# Patient Record
Sex: Female | Born: 1975 | Race: White | Hispanic: No | Marital: Single | Smoking: Current every day smoker
Health system: Southern US, Community
[De-identification: ages and names within clinical notes are randomized; demographics above are authoritative.]

## PROBLEM LIST (undated history)

## (undated) DIAGNOSIS — J40 Bronchitis, not specified as acute or chronic: Secondary | ICD-10-CM

## (undated) DIAGNOSIS — J189 Pneumonia, unspecified organism: Secondary | ICD-10-CM

---

## 2015-12-28 ENCOUNTER — Emergency Department (INDEPENDENT_AMBULATORY_CARE_PROVIDER_SITE_OTHER)
Admission: EM | Admit: 2015-12-28 | Discharge: 2015-12-28 | Disposition: A | Discharge status: Home / Self Care | Payer: Medicaid Other | Source: Home / Self Care | Attending: Family Medicine | Admitting: Family Medicine

## 2015-12-28 ENCOUNTER — Emergency Department (INDEPENDENT_AMBULATORY_CARE_PROVIDER_SITE_OTHER): Payer: Medicaid Other

## 2015-12-28 ENCOUNTER — Encounter: Payer: Self-pay | Admitting: Emergency Medicine

## 2015-12-28 DIAGNOSIS — J069 Acute upper respiratory infection, unspecified: Secondary | ICD-10-CM

## 2015-12-28 DIAGNOSIS — R509 Fever, unspecified: Secondary | ICD-10-CM

## 2015-12-28 DIAGNOSIS — R05 Cough: Secondary | ICD-10-CM

## 2015-12-28 DIAGNOSIS — B9789 Other viral agents as the cause of diseases classified elsewhere: Principal | ICD-10-CM

## 2015-12-28 HISTORY — DX: Bronchitis, not specified as acute or chronic: J40

## 2015-12-28 HISTORY — DX: Pneumonia, unspecified organism: J18.9

## 2015-12-28 MED ORDER — BENZONATATE 200 MG PO CAPS: 200.0000 mg | ORAL_CAPSULE | Freq: Every day | ORAL | Status: AC

## 2015-12-28 MED ORDER — AZITHROMYCIN 250 MG PO TABS: ORAL_TABLET | ORAL | Status: AC

## 2015-12-28 NOTE — ED Notes (Signed)
Reports onset of cough with fever greater than 100 two days ago; now has laryngitis and pain in upper chest when she coughs. Has had both bronchitis and pneumonia in past.

## 2015-12-28 NOTE — Discharge Instructions (Signed)
Take plain guaifenesin (1200mg  extended release tabs such as Mucinex) twice daily, with plenty of water, for cough and congestion.  May add Pseudoephedrine (30mg , one or two every 4 to 6 hours) for sinus congestion.  Get adequate rest.   May use Afrin nasal spray (or generic oxymetazoline) twice daily for about 5 days and then discontinue.  Also recommend using saline nasal spray several times daily and saline nasal irrigation (AYR is a common brand).  Use Flonase nasal spray each morning after using Afrin nasal spray and saline nasal irrigation. Try warm salt water gargles for sore throat.  Stop all antihistamines for now, and other non-prescription cough/cold preparations. May take Ibuprofen 200mg , 4 tabs every 8 hours with food for headache, fever, sore throat, etc.   Follow-up with family doctor if not improving about10 days.

## 2015-12-28 NOTE — ED Provider Notes (Signed)
CSN: [id number]     Arrival date & time 12/28/15  1710 History   None    Chief Complaint  Patient presents with  . Cough  . Fever      HPI Comments: Patient complains of two day history of typical cold-like symptoms including mild sore throat, sinus congestion, headache, fatigue, and cough.  She had fever initially to 102. She has a history of bronchitis, and pneumonia two years ago.  The history is provided by the patient.    Past Medical History  Diagnosis Date  . Bronchitis   . Pneumonia    History reviewed. No pertinent past surgical history. Family History  Problem Relation Age of Onset  . Diabetes Sister    Social History  Substance Use Topics  . Smoking status: Current Every Day Smoker  . Smokeless tobacco: None  . Alcohol Use: Yes   OB History    No data available     Review of Systems + sore throat + hoarse + cough No pleuritic pain No wheezing + nasal congestion + post-nasal drainage No sinus pain/pressure No itchy/red eyes No earache No hemoptysis No SOB + fever, + chills No nausea No vomiting No abdominal pain No diarrhea No urinary symptoms No skin rash + fatigue No myalgias + headache Used OTC meds without relief  Allergies  Review of patient's allergies indicates no known allergies.  Home Medications   Prior to Admission medications   Medication Sig Start Date End Date Taking? Authorizing Provider  azithromycin (ZITHROMAX Z-PAK) 250 MG tablet Take 2 tabs today; then begin one tab once daily for 4 more days. 12/28/15   Lattie HawStephen A Beese, MD  benzonatate (TESSALON) 200 MG capsule Take 1 capsule (200 mg total) by mouth at bedtime. Take as needed for cough 12/28/15   Lattie HawStephen A Beese, MD   Meds Ordered and Administered this Visit  Medications - No data to display  BP 111/72 mmHg  Pulse 95  Temp(Src) 98.6 F (37 C) (Oral)  Resp 16  Ht 5\' 6"  (1.676 m)  Wt 145 lb (65.772 kg)  BMI 23.41 kg/m2  SpO2 99%  LMP 12/10/2015 No data  found.   Physical Exam Nursing notes and Vital Signs reviewed. Appearance:  Patient appears stated age, and in no acute distress Eyes:  Pupils are equal, round, and reactive to light and accomodation.  Extraocular movement is intact.  Conjunctivae are not inflamed  Ears:  Canals normal.  Tympanic membranes normal.  Nose:  Mildly congested turbinates.  No sinus tenderness.   Pharynx:  Normal Neck:  Supple.  Tender enlarged posterior/lateral nodes are palpated bilaterally  Lungs:  Clear to auscultation.  Breath sounds are equal.  Moving air well. Heart:  Regular rate and rhythm without murmurs, rubs, or gallops.  Abdomen:  Nontender without masses or hepatosplenomegaly.  Bowel sounds are present.  No CVA or flank tenderness.  Extremities:  No edema.  Skin:  No rash present.   ED Course  Procedures none   Imaging Review Dg Chest 2 View  12/28/2015  CLINICAL DATA:  Cough, fever. EXAM: CHEST  2 VIEW COMPARISON:  None. FINDINGS: The heart size and mediastinal contours are within normal limits. Both lungs are clear. No pneumothorax or pleural effusion is noted. The visualized skeletal structures are unremarkable. IMPRESSION: No active cardiopulmonary disease. Electronically Signed   By: Lupita RaiderJames  Green Jr, M.D.   On: 12/28/2015 17:59      MDM   1. Viral URI with cough  With her past history of pneumonia, will begin empiric Z-pack. Prescription written for Benzonatate [facility]) to take at bedtime for night-time cough.  Take plain guaifenesin (  extended release tabs such as Mucinex) twice daily, with plenty of water, for cough and congestion.  May add Pseudoephedrine ( , one or two every 4 to 6 hours) for sinus congestion.  Get adequate rest.   May use Afrin nasal spray (or generic oxymetazoline) twice daily for about 5 days and then discontinue.  Also recommend using saline nasal spray several times daily and saline nasal irrigation (AYR is a common brand).  Use Flonase nasal spray  each morning after using Afrin nasal spray and saline nasal irrigation. Try warm salt water gargles for sore throat.  Stop all antihistamines for now, and other non-prescription cough/cold preparations. May take Ibuprofen , 4 tabs every 8 hours with food for headache, fever, sore throat, etc.   Follow-up with family doctor if not improving about10 days.     Lattie Haw, MD 12/31/15 1225

## 2016-12-12 IMAGING — DX DG CHEST 2V
2 series · 2 of 2 positions shown · non-contrast
Comparison: None.

CLINICAL DATA: Cough, fever.

EXAM:
CHEST  2 VIEW

[chest pa]
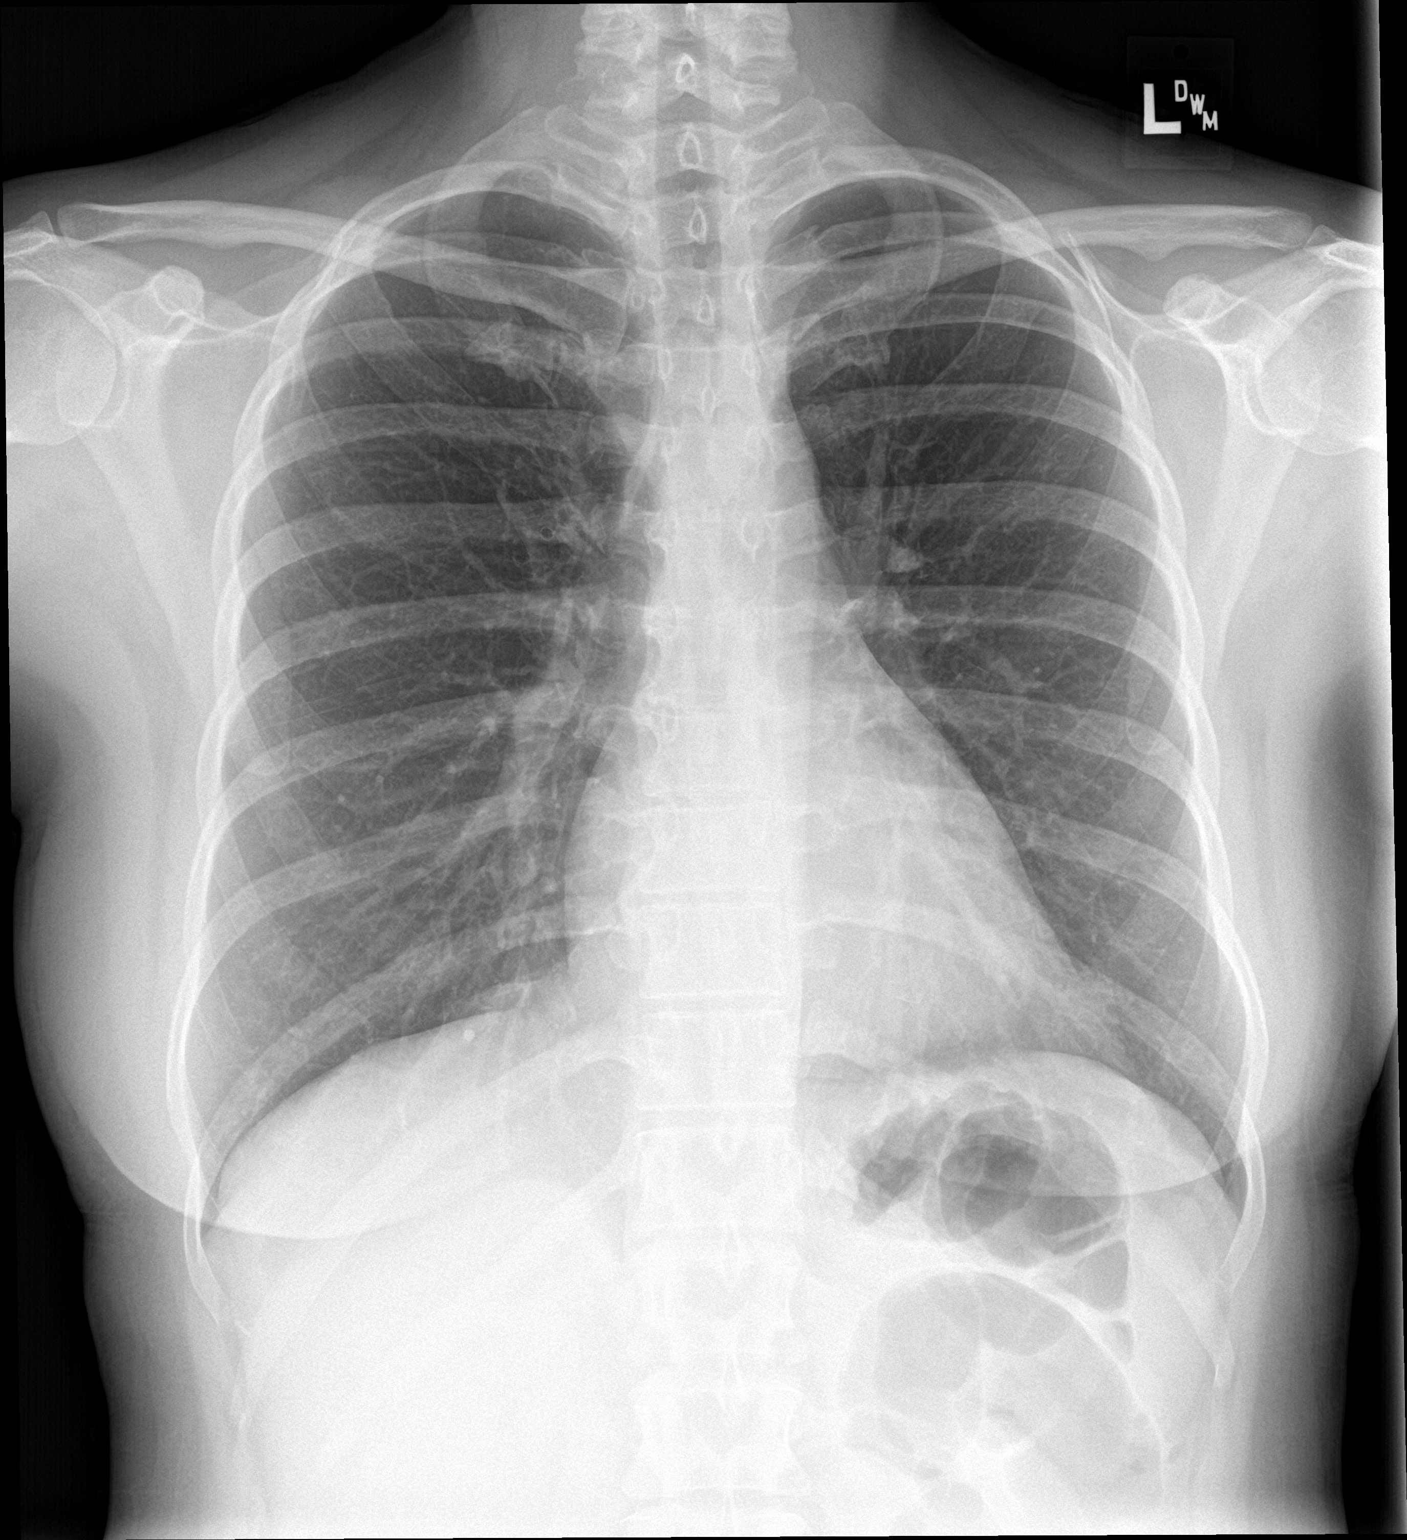

[chest lat]
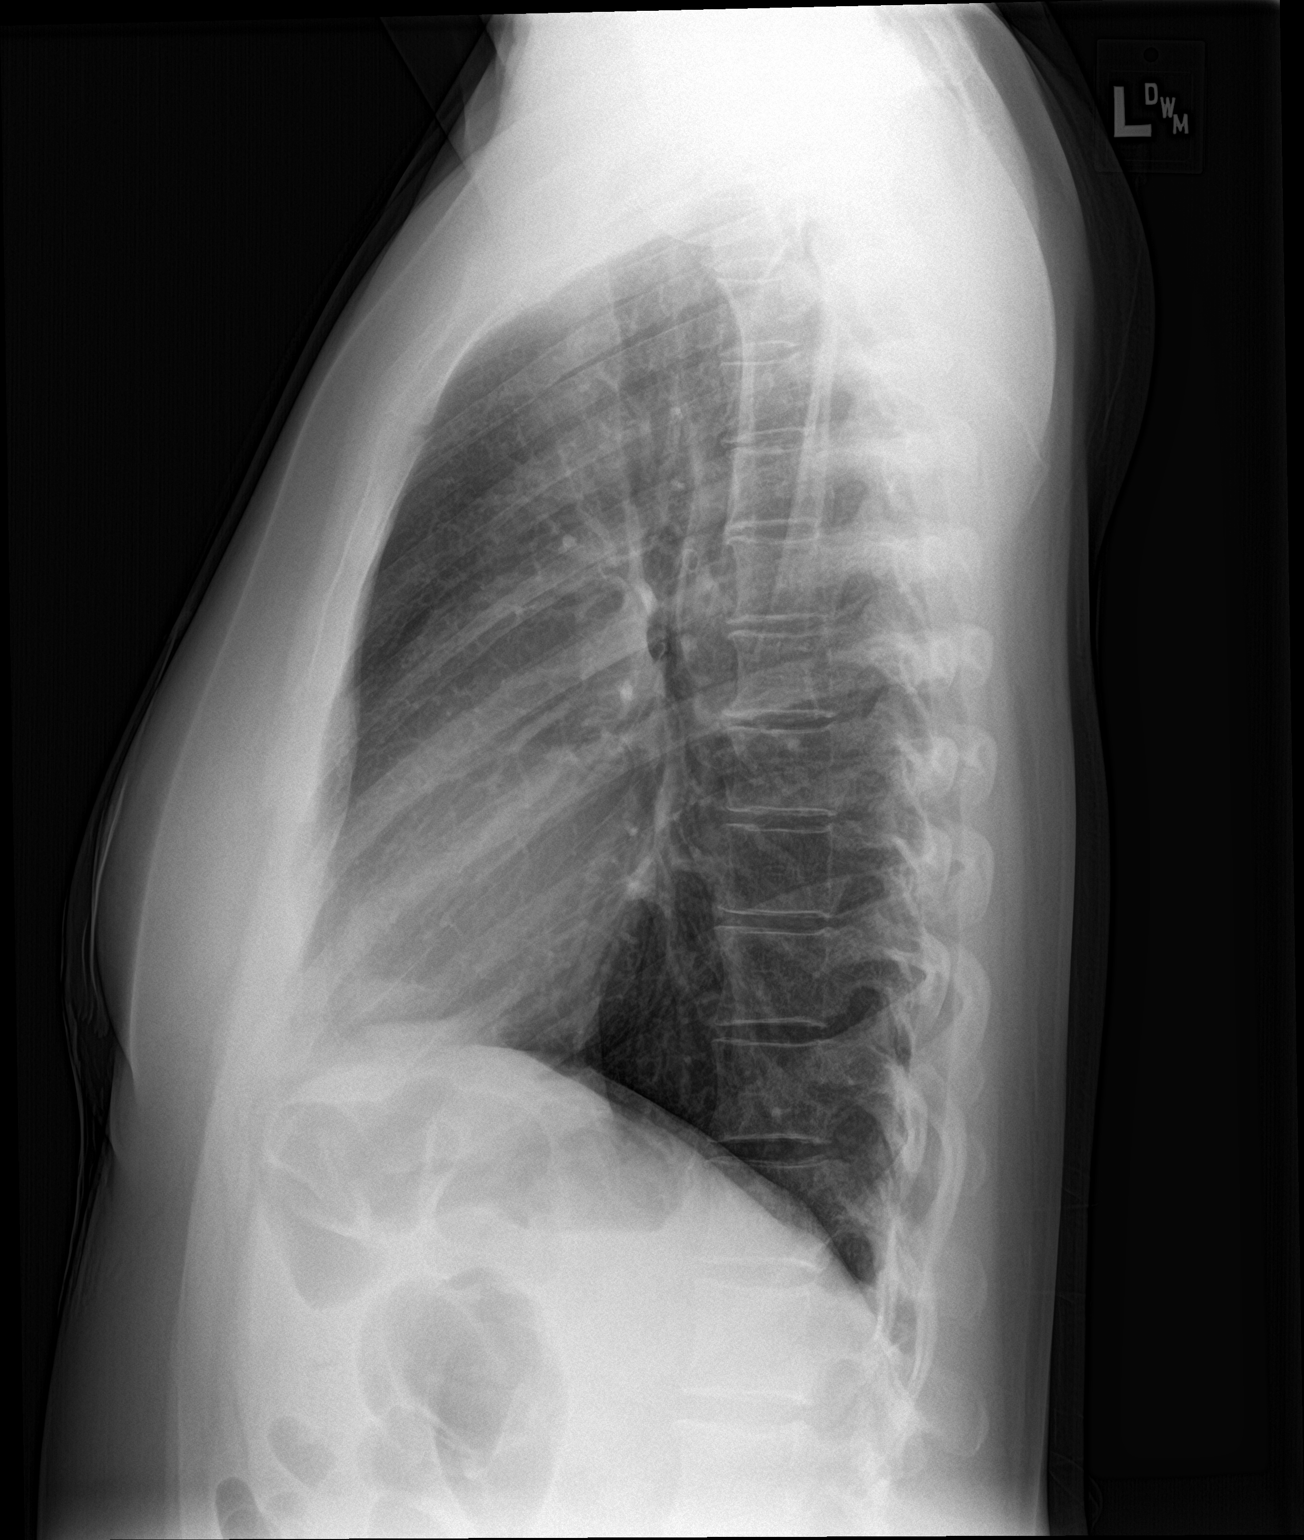

[2 of 2 positions shown; findings below may reference images not displayed]

FINDINGS: The heart size and mediastinal contours are within normal limits.
Both lungs are clear. No pneumothorax or pleural effusion is noted.
The visualized skeletal structures are unremarkable.
IMPRESSION: No active cardiopulmonary disease.
